# Patient Record
Sex: Male | Born: 1985 | Race: Black or African American | Hispanic: No | Marital: Married | Smoking: Current every day smoker
Health system: Southern US, Community
[De-identification: ages and names within clinical notes are randomized; demographics above are authoritative.]

## PROBLEM LIST (undated history)

## (undated) DIAGNOSIS — E119 Type 2 diabetes mellitus without complications: Secondary | ICD-10-CM

## (undated) DIAGNOSIS — J45909 Unspecified asthma, uncomplicated: Secondary | ICD-10-CM

## (undated) DIAGNOSIS — J302 Other seasonal allergic rhinitis: Secondary | ICD-10-CM

---
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
## 2006-02-25 ENCOUNTER — Emergency Department (HOSPITAL_COMMUNITY): Admission: EM | Admit: 2006-02-25 | Discharge: 2006-02-25 | Payer: Self-pay | Admitting: Emergency Medicine

## 2009-07-27 ENCOUNTER — Emergency Department (HOSPITAL_COMMUNITY): Admission: EM | Admit: 2009-07-27 | Discharge: 2009-07-27 | Payer: Self-pay | Admitting: Emergency Medicine

## 2010-02-20 ENCOUNTER — Emergency Department (HOSPITAL_COMMUNITY): Admission: EM | Admit: 2010-02-20 | Discharge: 2010-02-20 | Payer: Self-pay | Admitting: Family Medicine

## 2014-02-10 ENCOUNTER — Encounter (HOSPITAL_COMMUNITY): Payer: Self-pay | Admitting: Emergency Medicine

## 2014-02-10 ENCOUNTER — Emergency Department (HOSPITAL_COMMUNITY)
Admission: EM | Admit: 2014-02-10 | Discharge: 2014-02-10 | Disposition: A | Discharge status: Home / Self Care | Payer: Self-pay | Attending: Emergency Medicine | Admitting: Emergency Medicine

## 2014-02-10 DIAGNOSIS — E119 Type 2 diabetes mellitus without complications: Secondary | ICD-10-CM | POA: Insufficient documentation

## 2014-02-10 DIAGNOSIS — M549 Dorsalgia, unspecified: Secondary | ICD-10-CM

## 2014-02-10 DIAGNOSIS — J45909 Unspecified asthma, uncomplicated: Secondary | ICD-10-CM | POA: Insufficient documentation

## 2014-02-10 DIAGNOSIS — F17200 Nicotine dependence, unspecified, uncomplicated: Secondary | ICD-10-CM | POA: Insufficient documentation

## 2014-02-10 DIAGNOSIS — Z791 Long term (current) use of non-steroidal anti-inflammatories (NSAID): Secondary | ICD-10-CM | POA: Insufficient documentation

## 2014-02-10 DIAGNOSIS — Z79899 Other long term (current) drug therapy: Secondary | ICD-10-CM | POA: Insufficient documentation

## 2014-02-10 DIAGNOSIS — K1370 Unspecified lesions of oral mucosa: Secondary | ICD-10-CM | POA: Insufficient documentation

## 2014-02-10 HISTORY — DX: Other seasonal allergic rhinitis: J30.2

## 2014-02-10 HISTORY — DX: Unspecified asthma, uncomplicated: J45.909

## 2014-02-10 HISTORY — DX: Type 2 diabetes mellitus without complications: E11.9

## 2014-02-10 MED ORDER — DEXAMETHASONE SODIUM PHOSPHATE 10 MG/ML IJ SOLN
10.0000 mg | Freq: Once | INTRAMUSCULAR | Status: AC
  Administered 2014-02-10: 10 mg via INTRAMUSCULAR
  Filled 2014-02-10: qty 1

## 2014-02-10 MED ORDER — NAPROXEN 500 MG PO TABS: 500.0000 mg | ORAL_TABLET | Freq: Two times a day (BID) | ORAL | Status: DC

## 2014-02-10 MED ORDER — MAGIC MOUTHWASH: 5.0000 mL | Freq: Two times a day (BID) | ORAL | Status: AC

## 2014-02-10 NOTE — ED Notes (Signed)
Pt c/o sore throat x 1 month, sts he tried benadryl at home and got relief and would feel better but it always comes back. Pt also c/o white/yellow coated tongue. Pt also c/o lower back pain after lifting a heavy case of water, sts tried ibuprofen at home and got relief but the pain hasn't gone all the way away. Nad, skin warm and dry, resp e/u.

## 2014-02-10 NOTE — ED Provider Notes (Signed)
CSN: [id number]     Arrival date & time 02/10/14  1512 History   This chart was scribed for non-physician practitioner Arthor Captain, PA-C working with No att. providers found by Joaquin Music, ED Scribe. This patient was seen in room TR06C/TR06C and the patient's care was started at 6:13 PM .   Chief Complaint  Patient presents with  . Back Pain  . Sore Throat   HPI HPI Comments: Brett Gordon is a 28 y.o. male who presents to the Emergency Department complaining of back pain and mouth sores. Patient states that 4 days ago he was lifting heavy water bottle when he felt severe pain in the right lower back with tingling and radiation down the right leg. Patient states that it has been constant since that time, worse when sitting for long periods of time. The remainder of her medications. It is relieved somewhat by lying flat.Denies weakness, loss of bowel/bladder function or saddle anesthesia. Denies neck stiffness, headache, rash.  Denies fever or recent procedures to back. Patient also complains of 2 weeks of non-painful mouth lesions involving the tongue in the inner surface of the lower lip. He states that they're nonpainful, non-itching. He says has never had anything like this and has no known contacts with similar symptoms.    Past Medical History  Diagnosis Date  . Asthma   . Seasonal allergies   . Diabetes mellitus without complication     controlled through weight and diet   History reviewed. No pertinent past surgical history. No family history on file. History  Substance Use Topics  . Smoking status: Current Every Day Smoker -- 0.50 packs/day    Types: Cigarettes  . Smokeless tobacco: Not on file  . Alcohol Use: No     Comment: socially    Review of Systems  Constitutional: Negative for chills and fatigue.  HENT: Positive for mouth sores. Negative for congestion and facial swelling.   Musculoskeletal: Positive for back pain. Negative for gait problem,  myalgias, neck pain and neck stiffness.  Skin: Negative for rash and wound.  Neurological: Negative for weakness.    Allergies  Review of patient's allergies indicates no known allergies.  Home Medications   Prior to Admission medications   Medication Sig Start Date End Date Taking? Authorizing Provider  diphenhydrAMINE (BENADRYL) 25 MG tablet Take 50 mg by mouth daily as needed for allergies.   Yes Historical Provider, MD  ibuprofen (ADVIL,MOTRIN) 200 MG tablet Take 800 mg by mouth daily as needed (for pain).   Yes Historical Provider, MD  oxyCODONE-acetaminophen (PERCOCET/ROXICET) 5-325 MG per tablet Take 1 tablet by mouth every 4 (four) hours as needed for severe pain.   Yes Historical Provider, MD   BP 146/82  Pulse 91  Temp(Src) 97.6 F (36.4 C) (Oral)  Resp 18  Ht 5\' 9"  (1.753 m)  Wt 235 lb (106.595 kg)  BMI 34.69 kg/m2  SpO2 98%  Physical Exam  Nursing note and vitals reviewed. Constitutional: He appears well-developed and well-nourished. No distress.  HENT:  Head: Normocephalic and atraumatic.  Tongue is coated. Circular and nodular a well demarcated lesions on the tongue. Well demarcated nodular lesions on the lip. Nontender.  Eyes: Conjunctivae are normal. No scleral icterus.  Neck: Normal range of motion. Neck supple.  Cardiovascular: Normal rate, regular rhythm and normal heart sounds.   Pulmonary/Chest: Effort normal and breath sounds normal. No respiratory distress.  Abdominal: Soft. There is no tenderness.  Musculoskeletal: He exhibits no edema.  Neurological:  He is alert.  Skin: Skin is warm and dry. He is not diaphoretic.  Psychiatric: His behavior is normal.    ED Course  Procedures  DIAGNOSTIC STUDIES: Oxygen Saturation is 98% on RA, normal by my interpretation.    COORDINATION OF CARE: 6:13 PM-Discussed treatment plan which includes Decadron, anti-inflammatory medications, dentist and orthopedist with pt at bedside and pt agreed to plan.   Labs  Review Labs Reviewed - No data to display  Imaging Review No results found.   EKG Interpretation None     MDM   Final diagnoses:  Mouth lesion  Back pain    Patient with nodular lesions of the mouth. Unsure of what these lesions are possibly [place] constructions. Will refer the patient to a dentist for further followup on lesions. Patient will be given Magic mouthwash.   Patient with back pain.  No neurological deficits and normal neuro exam.  Patient can walk but states is painful.  No loss of bowel or bladder control.  No concern for cauda equina.  No fever, night sweats, weight loss, h/o cancer, IVDU.  RICE protocol and pain medicine indicated and discussed with patient.    I personally performed the services described in this documentation, which was scribed in my presence. The recorded information has been reviewed and is accurate.    Arthor CaptainAbigail Wilmar Prabhakar, PA-C 02/16/14 1717

## 2014-02-10 NOTE — Discharge Instructions (Signed)
SEEK IMMEDIATE MEDICAL ATTENTION IF: New numbness, tingling, weakness, or problem with the use of your arms or legs.  Severe back pain not relieved with medications.  Change in bowel or bladder control.  Increasing pain in any areas of the body (such as chest or abdominal pain).  Shortness of breath, dizziness or fainting.  Nausea (feeling sick to your stomach), vomiting, fever, or sweats.   Please follow up with a dentist regarding your mouth lesions.  Back Pain, Adult Low back pain is very common. About 1 in 5 people have back pain.The cause of low back pain is rarely dangerous. The pain often gets better over time.About half of people with a sudden onset of back pain feel better in just 2 weeks. About 8 in 10 people feel better by 6 weeks.  CAUSES Some common causes of back pain include:  Strain of the muscles or ligaments supporting the spine.  Wear and tear (degeneration) of the spinal discs.  Arthritis.  Direct injury to the back. DIAGNOSIS Most of the time, the direct cause of low back pain is not known.However, back pain can be treated effectively even when the exact cause of the pain is unknown.Answering your caregiver's questions about your overall health and symptoms is one of the most accurate ways to make sure the cause of your pain is not dangerous. If your caregiver needs more information, he or she may order lab work or imaging tests (X-rays or MRIs).However, even if imaging tests show changes in your back, this usually does not require surgery. HOME CARE INSTRUCTIONS For many people, back pain returns.Since low back pain is rarely dangerous, it is often a condition that people can learn to [facility] their own.   Remain active. It is stressful on the back to sit or stand in one place. Do not sit, drive, or stand in one place for more than 30 minutes at a time. Take short walks on level surfaces as soon as pain allows.Try to increase the length of time you walk each  day.  Do not stay in bed.Resting more than 1 or 2 days can delay your recovery.  Do not avoid exercise or work.Your body is made to move.It is not dangerous to be active, even though your back may hurt.Your back will likely heal faster if you return to being active before your pain is gone.  Pay attention to your body when you bend and lift. Many people have less discomfortwhen lifting if they bend their knees, keep the load close to their bodies,and avoid twisting. Often, the most comfortable positions are those that put less stress on your recovering back.  Find a comfortable position to sleep. Use a firm mattress and lie on your side with your knees slightly bent. If you lie on your back, put a pillow under your knees.  Only take over-the-counter or prescription medicines as directed by your caregiver. Over-the-counter medicines to reduce pain and inflammation are often the most helpful.Your caregiver may prescribe muscle relaxant drugs.These medicines help dull your pain so you can more quickly return to your normal activities and healthy exercise.  Put ice on the injured area.  Put ice in a plastic bag.  Place a towel between your skin and the bag.  Leave the ice on for 15-20 minutes, 03-04 times a day for the first 2 to 3 days. After that, ice and heat may be alternated to reduce pain and spasms.  Ask your caregiver about trying back exercises and gentle massage.  This may be of some benefit.  Avoid feeling anxious or stressed.Stress increases muscle tension and can worsen back pain.It is important to recognize when you are anxious or stressed and learn ways to manage it.Exercise is a great option. SEEK MEDICAL CARE IF:  You have pain that is not relieved with rest or medicine.  You have pain that does not improve in 1 week.  You have new symptoms.  You are generally not feeling well. SEEK IMMEDIATE MEDICAL CARE IF:   You have pain that radiates from your back into  your legs.  You develop new bowel or bladder control problems.  You have unusual weakness or numbness in your arms or legs.  You develop nausea or vomiting.  You develop abdominal pain.  You feel faint. Document Released: 08/25/2005 Document Revised: 02/24/2012 Document Reviewed: 01/13/2011 [facility] Patient Information 2014 [place], [place]. Stomatitis Stomatitis is an inflammation of the mucous lining of the mouth. It can affect part of the mouth or the whole mouth. The intensity of symptoms can range from mild to severe. It can affect your cheek, teeth, gums, lips, or tongue. In almost all cases, the lining of the mouth becomes swollen, red, and painful. Painful ulcers can develop in your mouth. Stomatitis recurs in some people. CAUSES  There are many common causes of stomatitis. They include:  Viruses (such as cold sores or shingles).  Canker sores.  Bacteria (such as ulcerative gingivitis or sexually transmitted diseases).  Fungus or yeast (such as candidiasis or oral thrush).  Poor oral hygiene and poor nutrition (Vincent's stomatitis or trench mouth).  Lack of vitamin B, vitamin C, or niacin.  Dentures or braces that do not fit properly.  High acid foods (uncommon).  Sharp or broken teeth.  Cheek biting.  Breathing through the mouth.  Chewing tobacco.  Allergy to toothpaste, mouthwash, candy, gum, lipstick, or some medicines.  Burning your mouth with hot drinks or food.  Exposure to dyes, heavy metals, acid fumes, or mineral dust. SYMPTOMS   Painful ulcers in the mouth.  Blisters in the mouth.  Bleeding gums.  Swollen gums.  Irritability.  Bad breath.  Bad taste in the mouth.  Fever.  Trouble eating because of burning and pain in the mouth. DIAGNOSIS  Your caregiver will examine your mouth and look for bleeding gums and mouth ulcers. Your caregiver may ask you about the medicines you are taking. Your caregiver may suggest a blood test and tissue  sample (biopsy) of the mouth ulcer or mass if either is present. This will help find the cause of your condition. TREATMENT  Your treatment will depend on the cause of your condition. Your caregiver will first try to treat your symptoms.   You may be given pain medicine. Topical anesthetic may be used to numb the area if you have severe pain.  Your caregiver may prescribe antibiotic medicine if you have a bacterial infection.  Your caregiver may prescribe antifungal medicine if you have a fungal infection.  You may need to take antiviral medicine if you have a viral infection like herpes.  You may be asked to use medicated mouth rinses.  Your caregiver will advise you about proper brushing and using a soft toothbrush. You also need to get your teeth cleaned regularly. HOME CARE INSTRUCTIONS   Maintain good oral hygiene. This is especially important for transplant patients.  Brush your teeth carefully with a soft, nylon-bristled toothbrush.  Floss at least 2 times a day.  Clean your mouth after  eating.  Rinse your mouth with salt water 3 to 4 times a day.  Gargle with cold water.  Use topical numbing medicines to decrease pain if recommended by your caregiver.  Stop smoking, and stop using chewing or smokeless tobacco.  Avoid eating hot and spicy foods.  Eat soft and bland food.  Reduce your stress wherever possible.  Eat healthy and nutritious foods. SEEK MEDICAL CARE IF:   Your symptoms persist or get worse.  You develop new symptoms.  Your mouth ulcers are present for more than 3 weeks.  Your mouth ulcers come back frequently.  You have increasing difficulty with normal eating and drinking.  You have increasing fatigue or weakness.  You develop loss of appetite or nausea. SEEK IMMEDIATE MEDICAL CARE IF:   You have a fever.  You develop pain, redness, or sores around one or both eyes.  You cannot eat or drink because of pain or other symptoms.  You  develop worsening weakness, or you faint.  You develop vomiting or diarrhea.  You develop chest pain, shortness of breath, or rapid and irregular heartbeats. MAKE SURE YOU:  Understand these instructions.  Will watch your condition.  Will get help right away if you are not doing well or get worse. Document Released: 06/22/2007 Document Revised: 11/17/2011 Document Reviewed: 04/03/2011 [facility] Patient Information 2014 [place].

## 2014-03-05 NOTE — ED Provider Notes (Signed)
Medical screening examination/treatment/procedure(s) were performed by non-physician practitioner and as supervising physician I was immediately available for consultation/collaboration.   EKG Interpretation None       Kamala Kolton M Eleni Frank, MD 03/05/14 2105 

## 2014-07-18 ENCOUNTER — Emergency Department (HOSPITAL_COMMUNITY)
Admission: EM | Admit: 2014-07-18 | Discharge: 2014-07-18 | Disposition: A | Discharge status: Home / Self Care | Payer: No Typology Code available for payment source | Attending: Emergency Medicine | Admitting: Emergency Medicine

## 2014-07-18 ENCOUNTER — Encounter (HOSPITAL_COMMUNITY): Payer: Self-pay | Admitting: Emergency Medicine

## 2014-07-18 DIAGNOSIS — E119 Type 2 diabetes mellitus without complications: Secondary | ICD-10-CM | POA: Insufficient documentation

## 2014-07-18 DIAGNOSIS — Z79899 Other long term (current) drug therapy: Secondary | ICD-10-CM | POA: Diagnosis not present

## 2014-07-18 DIAGNOSIS — Z791 Long term (current) use of non-steroidal anti-inflammatories (NSAID): Secondary | ICD-10-CM | POA: Diagnosis not present

## 2014-07-18 DIAGNOSIS — Y92410 Unspecified street and highway as the place of occurrence of the external cause: Secondary | ICD-10-CM | POA: Diagnosis not present

## 2014-07-18 DIAGNOSIS — J45909 Unspecified asthma, uncomplicated: Secondary | ICD-10-CM | POA: Insufficient documentation

## 2014-07-18 DIAGNOSIS — Z720 Tobacco use: Secondary | ICD-10-CM | POA: Diagnosis not present

## 2014-07-18 DIAGNOSIS — S161XXA Strain of muscle, fascia and tendon at neck level, initial encounter: Secondary | ICD-10-CM | POA: Insufficient documentation

## 2014-07-18 DIAGNOSIS — Y9389 Activity, other specified: Secondary | ICD-10-CM | POA: Diagnosis not present

## 2014-07-18 DIAGNOSIS — Y998 Other external cause status: Secondary | ICD-10-CM | POA: Insufficient documentation

## 2014-07-18 DIAGNOSIS — S0990XA Unspecified injury of head, initial encounter: Secondary | ICD-10-CM | POA: Insufficient documentation

## 2014-07-18 DIAGNOSIS — S199XXA Unspecified injury of neck, initial encounter: Secondary | ICD-10-CM | POA: Diagnosis present

## 2014-07-18 MED ORDER — NAPROXEN 500 MG PO TABS: 500.0000 mg | ORAL_TABLET | Freq: Two times a day (BID) | ORAL | Status: AC

## 2014-07-18 MED ORDER — CYCLOBENZAPRINE HCL 10 MG PO TABS: 10.0000 mg | ORAL_TABLET | Freq: Two times a day (BID) | ORAL | Status: AC | PRN
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
## 2018-06-14 ENCOUNTER — Ambulatory Visit: Payer: Self-pay | Attending: Family Medicine | Admitting: Podiatry

## 2018-06-14 DIAGNOSIS — B353 Tinea pedis: Secondary | ICD-10-CM

## 2018-06-15 NOTE — Progress Notes (Signed)
Subjective:   Patient ID: Brett Gordon, male   DOB: 32 y.o.   MRN: [id number]   HPI 32 year old [facility] concerns of athlete's foot, blisters to the bottom of his left foot.  He states he had this previously he was given Lamisil by mouth for couple weeks and it cleared it up.  He said no treatment recently for it.  Currently denies any blisters but he states that he does get dry peeling skin in the palm of his foot that is causing some itching.  This been ongoing last couple weeks.  Denies any swelling or redness.  He said no recent treatment.  No other concerns.   Review of Systems  All other systems reviewed and are negative.  Past Medical History:  Diagnosis Date  . Asthma   . Diabetes mellitus without complication    controlled through weight and diet  . Seasonal allergies     No past surgical history on file.   Current Outpatient Medications:  .  Alum & Mag Hydroxide-Simeth (MAGIC MOUTHWASH) SOLN, Take 5 mLs by mouth 2 (two) times daily., Disp: 70 mL, Rfl: 0 .  cyclobenzaprine (FLEXERIL) 10 MG tablet, Take 1 tablet (10 mg total) by mouth 2 (two) times daily as needed for muscle spasms., Disp: 20 tablet, Rfl: 0 .  diphenhydrAMINE (BENADRYL) 25 MG tablet, Take 50 mg by mouth daily as needed for allergies., Disp: , Rfl:  .  ibuprofen (ADVIL,MOTRIN) 200 MG tablet, Take 800 mg by mouth daily as needed (for pain)., Disp: , Rfl:  .  naproxen (NAPROSYN) 500 MG tablet, Take 1 tablet (500 mg total) by mouth 2 (two) times daily., Disp: 30 tablet, Rfl: 0 .  oxyCODONE-acetaminophen (PERCOCET/ROXICET) 5-325 MG per tablet, Take 1 tablet by mouth every 4 (four) hours as needed for severe pain., Disp: , Rfl:   No Known Allergies       Objective:  Physical Exam  General: AAO x3, NAD  Dermatological: Dry, peeling skin to the plantar aspect the left foot but there is no blisters identified at this time but there appears to be old  blisters have dried.  This is more of a pustular type tinea pedis.  There is no open sores and there is no drainage or pus there is no edema, erythema to the foot there is no other signs of infection present.  Vascular: Dorsalis Pedis artery and Posterior Tibial artery pedal pulses are 2/4 bilateral with immedate capillary fill time. There is no pain with calf compression, swelling, warmth, erythema.   Neruologic: Grossly intact via light touch bilateral.  Protective threshold with Semmes Wienstein monofilament intact to all pedal sites bilateral.  Musculoskeletal: No gross boney pedal deformities bilateral. No pain, crepitus, or limitation noted with foot and ankle range of motion bilateral. Muscular strength 5/5 in all groups tested bilateral.  Gait: Unassisted, Nonantalgic.      Assessment:   Tinea pedis left foot     Plan:  -Treatment options discussed including all alternatives, risks, and complications -Etiology of symptoms were discussed -I do think he will benefit from 2 weeks of oral Lamisil also going to do a topical anti-inflammatory cream for him.  However he recently had blood work and check a copy of the blood work including LFT from his primary care physician before starting the medication he understands this.  Once that comes back on going to prescribe medicine to the health department in [facility].  Lesia Sago  Jacqualyn Posey DPM

## 2018-06-23 ENCOUNTER — Telehealth: Payer: Self-pay | Admitting: Podiatry

## 2018-06-23 NOTE — Telephone Encounter (Signed)
Pt called and said he is needing his Lamisil rx and the other rx for the cream sent to [facility] in [place]. [facility].  Upon reviewing your not pt had labs done at his pcp and I did not see them in the chart so I asked pt to have his pcp fax them to our office with attention to me.. I explained that you need those to make sure it was ok for him to start the medication. I also made pt aware you were in surgery today so it may not be until tomorrow that he hears back from [place].

## 2018-06-24 ENCOUNTER — Telehealth: Payer: Self-pay | Admitting: *Deleted

## 2018-06-24 MED ORDER — TERBINAFINE HCL 250 MG PO TABS: 250.0000 mg | ORAL_TABLET | Freq: Every day | ORAL | 0 refills | Status: AC

## 2018-06-24 MED ORDER — KETOCONAZOLE 2 % EX CREA: TOPICAL_CREAM | Freq: Every day | CUTANEOUS | Status: AC

## 2018-06-24 NOTE — Addendum Note (Signed)
Addended by: Hadley Pen R on: 06/24/2018 04:39 PM   Modules accepted: Orders

## 2018-06-24 NOTE — Addendum Note (Signed)
Addended by: Hadley Pen R on: 06/24/2018 04:29 PM   Modules accepted: Orders

## 2018-06-24 NOTE — Telephone Encounter (Signed)
Called the [facility] of [place] and spoke with Misty Stanley and the pharmacy tech stated that they do not carry the lamisil or the ketoconazole cream and I did leave a message for the patient to call me back. Misty Stanley

## 2018-06-24 NOTE — Telephone Encounter (Signed)
Called and left a message for the patient to call me back in the [place] office at [phone]. Brett Gordon 

## 2018-06-24 NOTE — Telephone Encounter (Signed)
Brett Gordon- can you please call in 14 days of Lamisil 250mg  daily (1 tablet). Also can you call in ketoconazole cream to be apply daily as well. He would like this sent to the [facility] in [place]. Thanks.

## 2018-06-26 ENCOUNTER — Telehealth: Payer: Self-pay | Admitting: *Deleted

## 2018-06-26 NOTE — Telephone Encounter (Signed)
Called patient on thurdsay and stated that the [place] [facility] [place] does not cover the prescriptions and patient stated to call into [facility] in [place]. Misty Stanley
